# Patient Record
Sex: Male | Born: 1975 | Hispanic: No | Marital: Married | Smoking: Never smoker
Health system: Southern US, Community
[De-identification: ages and names within clinical notes are randomized; demographics above are authoritative.]

## PROBLEM LIST (undated history)

## (undated) DIAGNOSIS — E785 Hyperlipidemia, unspecified: Secondary | ICD-10-CM

## (undated) DIAGNOSIS — I10 Essential (primary) hypertension: Secondary | ICD-10-CM

## (undated) DIAGNOSIS — E119 Type 2 diabetes mellitus without complications: Secondary | ICD-10-CM

## (undated) HISTORY — DX: Hyperlipidemia, unspecified: E78.5

## (undated) HISTORY — DX: Type 2 diabetes mellitus without complications: E11.9

## (undated) HISTORY — DX: Essential (primary) hypertension: I10

---

## 2017-10-25 DIAGNOSIS — E119 Type 2 diabetes mellitus without complications: Secondary | ICD-10-CM | POA: Insufficient documentation

## 2017-10-25 DIAGNOSIS — K219 Gastro-esophageal reflux disease without esophagitis: Secondary | ICD-10-CM | POA: Insufficient documentation

## 2017-10-25 DIAGNOSIS — I10 Essential (primary) hypertension: Secondary | ICD-10-CM | POA: Insufficient documentation

## 2017-10-25 DIAGNOSIS — E7800 Pure hypercholesterolemia, unspecified: Secondary | ICD-10-CM | POA: Insufficient documentation

## 2017-10-25 DIAGNOSIS — Z23 Encounter for immunization: Secondary | ICD-10-CM | POA: Insufficient documentation

## 2019-10-19 ENCOUNTER — Encounter: Payer: Self-pay | Admitting: *Deleted

## 2019-11-01 ENCOUNTER — Encounter: Payer: Self-pay | Admitting: Cardiology

## 2019-11-01 ENCOUNTER — Other Ambulatory Visit: Payer: Self-pay

## 2019-11-01 ENCOUNTER — Ambulatory Visit (INDEPENDENT_AMBULATORY_CARE_PROVIDER_SITE_OTHER): Payer: Managed Care, Other (non HMO) | Admitting: Cardiology

## 2019-11-01 VITALS — BP 112/64 | HR 87 | Ht 70.0 in | Wt 213.2 lb

## 2019-11-01 DIAGNOSIS — R079 Chest pain, unspecified: Secondary | ICD-10-CM

## 2019-11-01 DIAGNOSIS — I10 Essential (primary) hypertension: Secondary | ICD-10-CM | POA: Diagnosis not present

## 2019-11-01 DIAGNOSIS — E7800 Pure hypercholesterolemia, unspecified: Secondary | ICD-10-CM

## 2019-11-01 DIAGNOSIS — R072 Precordial pain: Secondary | ICD-10-CM

## 2019-11-01 DIAGNOSIS — Z01818 Encounter for other preprocedural examination: Secondary | ICD-10-CM | POA: Diagnosis not present

## 2019-11-01 MED ORDER — METOPROLOL TARTRATE 100 MG PO TABS: 100.0000 mg | ORAL_TABLET | Freq: Once | ORAL | 0 refills | Status: DC

## 2019-11-01 NOTE — Patient Instructions (Signed)
Medication Instructions:   1.  Take metoprolol tartrate (LOPRESSOR) 100 MG tablet: Take 2 hours prior to your CT scan 2.  Hold your BP medications night before or morning of your CTA:      losartan (COZAAR) 100 MG       hydrochlorothiazide (MICROZIDE)   *If you need a refill on your cardiac medications before your next appointment, please call your pharmacy*   Lab Work:  Please get a lab draw within 2 weeks of your CTA scan.  - Please go to the [facility]. You will check in at the front desk to the right as you walk into the atrium. Valet Parking is offered if needed.  - No appointment needed. You may go any day between 7 am and 6 pm. If you have labs (blood work) drawn today and your tests are completely normal, you will receive your results only by: Marland Kitchen MyChart Message (if you have MyChart) OR . A paper copy in the mail If you have any lab test that is abnormal or we need to change your treatment, we will call you to review the results.   Testing/Procedures:  1. Your physician has requested that you have an echocardiogram. Echocardiography is a painless test that uses sound waves to create images of your heart. It provides your doctor with information about the size and shape of your heart and how well your heart's chambers and valves are working. This procedure takes approximately one hour. There are no restrictions for this procedure.  2.  Your physician has requested that you have cardiac CT. Cardiac computed tomography (CT) is a painless test that uses an x-ray machine to take clear, detailed pictures of your heart.  Your cardiac CT will be scheduled at one of the below locations:    [facility] [address] [place], [place] [address] [phone]    At [facility], please arrive 15 mins early for check-in and test prep.    Please follow these instructions carefully (unless otherwise  directed):   Hold all erectile dysfunction medications at least 3 days (72 hrs) prior to test.   On the Night Before the Test: . Be sure to Drink plenty of water. . Do not consume any caffeinated/decaffeinated beverages or chocolate 12 hours prior to your test. . Do not take any antihistamines 12 hours prior to your test.   On the Day of the Test: . Drink plenty of water. Do not drink any water within one hour of the test. . Do not eat any food 4 hours prior to the test. . You may take your regular medications prior to the test.  . Take metoprolol (Lopressor) two hours prior to test.  Hold your losartan (COZAAR) 100 MG hydrochlorothiazide (MICROZIDE)          After the Test: . Drink plenty of water. . After receiving IV contrast, you may experience a mild flushed feeling. This is normal. . On occasion, you may experience a mild rash up to 24 hours after the test. This is not dangerous. If this occurs, you can take Benadryl 25 mg and increase your fluid intake. . If you experience trouble breathing, this can be serious. If it is severe call 911 IMMEDIATELY. If it is mild, please call our office. . If you take any of these medications: Glipizide/Metformin, Avandament, Glucavance, please do not take 48 hours after completing test unless otherwise instructed.   Once we have confirmed  authorization from your insurance company, we will call you to set up a date and time for your test. Based on how quickly your insurance processes prior authorizations requests, please allow up to 4 weeks to be contacted for scheduling your Cardiac CT appointment. Be advised that routine Cardiac CT appointments could be scheduled as many as 8 weeks after your provider has ordered it.  For non-scheduling related questions, please contact the cardiac imaging nurse navigator should you have any questions/concerns: Marchia Bond, Cardiac Imaging Nurse Navigator Burley Saver, Interim Cardiac Imaging Nurse  [place] and Vascular Services Direct Office Dial: [phone]   For scheduling needs, including cancellations and rescheduling, please call Vivien Rota at [phone], option 3.    Follow-Up: At [facility], you and your health needs are our priority.  As part of our continuing mission to provide you with exceptional heart care, we have created designated Provider Care Teams.  These Care Teams include your primary Cardiologist (physician) and Advanced Practice Providers (APPs -  Physician Assistants and Nurse Practitioners) who all work together to provide you with the care you need, when you need it.  We recommend signing up for the patient portal called "MyChart".  Sign up information is provided on this After Visit Summary.  MyChart is used to connect with patients for Virtual Visits (Telemedicine).  Patients are able to view lab/test results, encounter notes, upcoming appointments, etc.  Non-urgent messages can be sent to your provider as well.   To learn more about what you can do with MyChart, go to [email or web address].    Your next appointment:   Follow up after CTA and Echo  The format for your next appointment:   In Person  Provider:   Kate Sable, MD   Other Instructions   Echocardiogram An echocardiogram is a procedure that uses painless sound waves (ultrasound) to produce an image of the heart. Images from an echocardiogram can provide important information about:  Signs of coronary artery disease (CAD).  Aneurysm detection. An aneurysm is a weak or damaged part of an artery wall that bulges out from the normal force of blood pumping through the body.  Heart size and shape. Changes in the size or shape of the heart can be associated with certain conditions, including heart failure, aneurysm, and CAD.  Heart muscle function.  Heart valve function.  Signs of a past heart attack.  Fluid buildup around the heart.  Thickening of the heart  muscle.  A tumor or infectious growth around the heart valves. Tell a health care provider about:  Any allergies you have.  All medicines you are taking, including vitamins, herbs, eye drops, creams, and over-the-counter medicines.  Any blood disorders you have.  Any surgeries you have had.  Any medical conditions you have.  Whether you are pregnant or may be pregnant. What are the risks? Generally, this is a safe procedure. However, problems may occur, including:  Allergic reaction to dye (contrast) that may be used during the procedure. What happens before the procedure? No specific preparation is needed. You may eat and drink normally. What happens during the procedure?   An IV tube may be inserted into one of your veins.  You may receive contrast through this tube. A contrast is an injection that improves the quality of the pictures from your heart.  A gel will be applied to your chest.  A wand-like tool (transducer) will be moved over your chest. The gel will help to transmit the sound waves  from the transducer.  The sound waves will harmlessly bounce off of your heart to allow the heart images to be captured in real-time motion. The images will be recorded on a computer. The procedure may vary among health care providers and hospitals. What happens after the procedure?  You may return to your normal, everyday life, including diet, activities, and medicines, unless your health care provider tells you not to do that. Summary  An echocardiogram is a procedure that uses painless sound waves (ultrasound) to produce an image of the heart.  Images from an echocardiogram can provide important information about the size and shape of your heart, heart muscle function, heart valve function, and fluid buildup around your heart.  You do not need to do anything to prepare before this procedure. You may eat and drink normally.  After the echocardiogram is completed, you may  return to your normal, everyday life, unless your health care provider tells you not to do that. This information is not intended to replace advice given to you by your health care provider. Make sure you discuss any questions you have with your health care provider. Document Revised: 07/13/2018 Document Reviewed: 04/24/2016 Elsevier Patient Education  [place].

## 2019-11-01 NOTE — Progress Notes (Signed)
Cardiology Office Note:    Date:  11/01/2019   ID:  Troy Hughes, DOB 02-02-1976, MRN [id number]  PCP:  Troy Gula, MD  [facility] Cardiologist:  Troy Sable, MD  [address] Electrophysiologist:  None   Referring MD: Troy Gula, MD   Chief Complaint  Patient presents with  . New Patient (Initial Visit)    Referred by PCP for Chest pain. Meds reviewed verbally with patient.    Troy Hughes is a 44 y.o. male who is being seen today for the evaluation of chest pain at the request of Troy Gula, MD.   History of Present Illness:    Troy Hughes is a 44 y.o. male with a hx of hypertension, hyperlipidemia, diabetes who presents due to chest pain. Patient had symptoms of chest discomfort first episode. Symptoms are not related with exertion. He states symptoms are moderate to significant maybe 1 out of 10, lasting a few seconds and then go home. Last episode was a month ago. He also notes occasional chest discomfort when stressed. He denies any history of heart disease, his mother for stroke. He denies smoking or alcohol use. He otherwise feels fine, currently denies chest pain.  Past Medical History:  Diagnosis Date  . Diabetes mellitus without complication ([place])   . Hyperlipidemia   . Hypertension     History reviewed. No pertinent surgical history.  Current Medications: Current Meds  Medication Sig  . empagliflozin (JARDIANCE) 10 MG TABS tablet Take by mouth daily.  . hydrochlorothiazide (MICROZIDE) 12.5 MG capsule Take 12.5 mg by mouth daily.  Marland Kitchen losartan (COZAAR) 100 MG tablet Take 100 mg by mouth daily.  Marland Kitchen lovastatin (MEVACOR) 20 MG tablet Take 20 mg by mouth at bedtime.  . metFORMIN (GLUCOPHAGE) 1000 MG tablet Take 1,000 mg by mouth 2 (two) times daily with a meal.     Allergies:   Patient has no known allergies.   Social History   Socioeconomic History  . Marital status: Married    Spouse name: Not on file  . Number of children: Not on  file  . Years of education: Not on file  . Highest education level: Not on file  Occupational History  . Not on file  Tobacco Use  . Smoking status: Never Smoker  . Smokeless tobacco: Never Used  Vaping Use  . Vaping Use: Never used  Substance and Sexual Activity  . Alcohol use: Never  . Drug use: Never  . Sexual activity: Not on file  Other Topics Concern  . Not on file  Social History Narrative  . Not on file   Social Determinants of Health   Financial Resource Strain:   . Difficulty of Paying Living Expenses:   Food Insecurity:   . Worried About Charity fundraiser in the Last Year:   . Arboriculturist in the Last Year:   Transportation Needs:   . Film/video editor (Medical):   Marland Kitchen Lack of Transportation (Non-Medical):   Physical Activity:   . Days of Exercise per Week:   . Minutes of Exercise per Session:   Stress:   . Feeling of Stress :   Social Connections:   . Frequency of Communication with Friends and Family:   . Frequency of Social Gatherings with Friends and Family:   . Attends Religious Services:   . Active Member of Clubs or Organizations:   . Attends Archivist Meetings:   Marland Kitchen Marital Status:      Family History:  The patient's family history includes Hyperlipidemia in his mother; Hypertension in his mother; Kidney disease in his father.  ROS:   Please see the history of present illness.     All other systems reviewed and are negative.  EKGs/Labs/Other Studies Reviewed:    The following studies were reviewed today:   EKG:  EKG is  ordered today.  The ekg ordered today demonstrates normal sinus rhythm, normal ECG  Recent Labs: No results found for requested labs within last 8760 hours.  Recent Lipid Panel No results found for: CHOL, TRIG, HDL, CHOLHDL, VLDL, LDLCALC, LDLDIRECT  Physical Exam:    VS:  BP (!) 112/64 (BP Location: Right Arm, Patient Position: Sitting, Cuff Size: Normal)   Pulse 87   Ht 5' 10" (1.778 m)   Wt (!)  213 lb 4 oz (96.7 kg)   BMI 30.60 kg/m     Wt Readings from Last 3 Encounters:  11/01/19 (!) 213 lb 4 oz (96.7 kg)     GEN:  Well nourished, well developed in no acute distress HEENT: Normal NECK: No JVD; No carotid bruits LYMPHATICS: No lymphadenopathy CARDIAC: RRR, no murmurs, rubs, gallops RESPIRATORY:  Clear to auscultation without rales, wheezing or rhonchi  ABDOMEN: Soft, non-tender, non-distended MUSCULOSKELETAL:  No edema; No deformity  SKIN: Warm and dry NEUROLOGIC:  Alert and oriented x 3 PSYCHIATRIC:  Normal affect   ASSESSMENT:    1. Chest pain of uncertain etiology   2. Essential hypertension   3. Pure hypercholesterolemia   4. Pre-procedural examination   5. Precordial pain    PLAN:    In order of problems listed above:  1. Patient with history of atypical chest discomfort. There is some typical nature to symptoms with chest discomfort sometimes occurring with patient is stressed. He has risk factors of hypertension, hyperlipidemia, diabetes. Will evaluate patient with coronary CTA for presence of CAD. Get echocardiogram to evaluate cardiac function. 2. History of hypertension, BP controlled continue BP meds. Hold BP meds night prior to coronary CTA. 3. History of hyperlipedemia, continue statin.  Follow-up after echo and coronary CTA.  This note was generated in part or whole with voice recognition software. Voice recognition is usually quite accurate but there are transcription errors that can and very often do occur. I apologize for any typographical errors that were not detected and corrected.  Medication Adjustments/Labs and Tests Ordered: Current medicines are reviewed at length with the patient today.  Concerns regarding medicines are outlined above.  Orders Placed This Encounter  Procedures  . CT CORONARY MORPH W/CTA COR W/SCORE W/CA W/CM &/OR WO/CM  . CT CORONARY FRACTIONAL FLOW RESERVE DATA PREP  . CT CORONARY FRACTIONAL FLOW RESERVE FLUID  ANALYSIS  . Basic Metabolic Panel (BMET)  . EKG 12-Lead  . ECHOCARDIOGRAM COMPLETE   Meds ordered this encounter  Medications  . metoprolol tartrate (LOPRESSOR) 100 MG tablet    Sig: Take 1 tablet (100 mg total) by mouth once for 1 dose. Take 2 hours prior to your CT scan.    Dispense:  1 tablet    Refill:  0    Patient Instructions  Medication Instructions:   1.  Take metoprolol tartrate (LOPRESSOR) 100 MG tablet: Take 2 hours prior to your CT scan 2.  Hold your BP medications night before or morning of your CTA:      losartan (COZAAR) 100 MG       hydrochlorothiazide (MICROZIDE)   *If you need a refill on your cardiac medications before  your next appointment, please call your pharmacy*   Lab Work:  Please get a lab draw within 2 weeks of your CTA scan.  - Please go to the [facility]. You will check in at the front desk to the right as you walk into the atrium. Valet Parking is offered if needed.  - No appointment needed. You may go any day between 7 am and 6 pm. If you have labs (blood work) drawn today and your tests are completely normal, you will receive your results only by: Marland Kitchen MyChart Message (if you have MyChart) OR . A paper copy in the mail If you have any lab test that is abnormal or we need to change your treatment, we will call you to review the results.   Testing/Procedures:  1. Your physician has requested that you have an echocardiogram. Echocardiography is a painless test that uses sound waves to create images of your heart. It provides your doctor with information about the size and shape of your heart and how well your heart's chambers and valves are working. This procedure takes approximately one hour. There are no restrictions for this procedure.  2.  Your physician has requested that you have cardiac CT. Cardiac computed tomography (CT) is a painless test that uses an x-ray machine to take clear, detailed pictures of your heart.  Your cardiac CT  will be scheduled at one of the below locations:    [facility] [address] [place], [place] [address] [phone]    At [facility], please arrive 15 mins early for check-in and test prep.    Please follow these instructions carefully (unless otherwise directed):   Hold all erectile dysfunction medications at least 3 days (72 hrs) prior to test.   On the Night Before the Test: . Be sure to Drink plenty of water. . Do not consume any caffeinated/decaffeinated beverages or chocolate 12 hours prior to your test. . Do not take any antihistamines 12 hours prior to your test.   On the Day of the Test: . Drink plenty of water. Do not drink any water within one hour of the test. . Do not eat any food 4 hours prior to the test. . You may take your regular medications prior to the test.  . Take metoprolol (Lopressor) two hours prior to test.  Hold your losartan (COZAAR) 100 MG hydrochlorothiazide (MICROZIDE)          After the Test: . Drink plenty of water. . After receiving IV contrast, you may experience a mild flushed feeling. This is normal. . On occasion, you may experience a mild rash up to 24 hours after the test. This is not dangerous. If this occurs, you can take Benadryl 25 mg and increase your fluid intake. . If you experience trouble breathing, this can be serious. If it is severe call 911 IMMEDIATELY. If it is mild, please call our office. . If you take any of these medications: Glipizide/Metformin, Avandament, Glucavance, please do not take 48 hours after completing test unless otherwise instructed.   Once we have confirmed authorization from your insurance company, we will call you to set up a date and time for your test. Based on how quickly your insurance processes prior authorizations requests, please allow up to 4 weeks to be contacted for scheduling your Cardiac CT appointment. Be advised  that routine Cardiac CT appointments could be scheduled as many as 8 weeks after your provider  has ordered it.  For non-scheduling related questions, please contact the cardiac imaging nurse navigator should you have any questions/concerns: Marchia Bond, Cardiac Imaging Nurse Navigator Burley Saver, Interim Cardiac Imaging Nurse [place] and Vascular Services Direct Office Dial: [phone]   For scheduling needs, including cancellations and rescheduling, please call Vivien Rota at [phone], option [phone].    Follow-Up: At [facility], you and your health needs are our priority.  As part of our continuing mission to provide you with exceptional heart care, we have created designated Provider Care Teams.  These Care Teams include your primary Cardiologist (physician) and Advanced Practice Providers (APPs -  Physician Assistants and Nurse Practitioners) who all work together to provide you with the care you need, when you need it.  We recommend signing up for the patient portal called "MyChart".  Sign up information is provided on this After Visit Summary.  MyChart is used to connect with patients for Virtual Visits (Telemedicine).  Patients are able to view lab/test results, encounter notes, upcoming appointments, etc.  Non-urgent messages can be sent to your provider as well.   To learn more about what you can do with MyChart, go to [email or web address].    Your next appointment:   Follow up after CTA and Echo  The format for your next appointment:   In Person  Provider:   Kate Sable, MD   Other Instructions   Echocardiogram An echocardiogram is a procedure that uses painless sound waves (ultrasound) to produce an image of the heart. Images from an echocardiogram can provide important information about:  Signs of coronary artery disease (CAD).  Aneurysm detection. An aneurysm is a weak or damaged part of an artery wall that bulges out from the normal force of  blood pumping through the body.  Heart size and shape. Changes in the size or shape of the heart can be associated with certain conditions, including heart failure, aneurysm, and CAD.  Heart muscle function.  Heart valve function.  Signs of a past heart attack.  Fluid buildup around the heart.  Thickening of the heart muscle.  A tumor or infectious growth around the heart valves. Tell a health care provider about:  Any allergies you have.  All medicines you are taking, including vitamins, herbs, eye drops, creams, and over-the-counter medicines.  Any blood disorders you have.  Any surgeries you have had.  Any medical conditions you have.  Whether you are pregnant or may be pregnant. What are the risks? Generally, this is a safe procedure. However, problems may occur, including:  Allergic reaction to dye (contrast) that may be used during the procedure. What happens before the procedure? No specific preparation is needed. You may eat and drink normally. What happens during the procedure?   An IV tube may be inserted into one of your veins.  You may receive contrast through this tube. A contrast is an injection that improves the quality of the pictures from your heart.  A gel will be applied to your chest.  A wand-like tool (transducer) will be moved over your chest. The gel will help to transmit the sound waves from the transducer.  The sound waves will harmlessly bounce off of your heart to allow the heart images to be captured in real-time motion. The images will be recorded on a computer. The procedure may vary among health care providers and hospitals. What happens after the procedure?  You may return to your normal, everyday life, including diet, activities, and medicines, unless  your health care provider tells you not to do that. Summary  An echocardiogram is a procedure that uses painless sound waves (ultrasound) to produce an image of the heart.  Images  from an echocardiogram can provide important information about the size and shape of your heart, heart muscle function, heart valve function, and fluid buildup around your heart.  You do not need to do anything to prepare before this procedure. You may eat and drink normally.  After the echocardiogram is completed, you may return to your normal, everyday life, unless your health care provider tells you not to do that. This information is not intended to replace advice given to you by your health care provider. Make sure you discuss any questions you have with your health care provider. Document Revised: 07/13/2018 Document Reviewed: 04/24/2016 Elsevier Patient Education  2020 [place], Troy Sable, MD  11/01/2019 12:36 PM    [place]

## 2019-11-13 ENCOUNTER — Other Ambulatory Visit: Payer: Self-pay | Admitting: Cardiology

## 2019-11-14 ENCOUNTER — Telehealth (HOSPITAL_COMMUNITY): Payer: Self-pay | Admitting: *Deleted

## 2019-11-14 LAB — BASIC METABOLIC PANEL
BUN/Creatinine Ratio: 9 (ref 9–20)
BUN: 8 mg/dL (ref 6–24)
CO2: 23 mmol/L (ref 20–29)
Calcium: 9.8 mg/dL (ref 8.7–10.2)
Chloride: 101 mmol/L (ref 96–106)
Creatinine, Ser: 0.86 mg/dL (ref 0.76–1.27)
GFR calc Af Amer: 124 mL/min/{1.73_m2} (ref >59)
GFR calc non Af Amer: 107 mL/min/{1.73_m2} (ref >59)
Glucose: 138 mg/dL — ABNORMAL HIGH (ref 65–99)
Potassium: 5 mmol/L (ref 3.5–5.2)
Sodium: 139 mmol/L (ref 134–144)

## 2019-11-14 NOTE — Telephone Encounter (Signed)
Reaching out to patient to offer assistance regarding upcoming cardiac imaging study; pt verbalizes understanding of appt date/time, parking situation and where to check in, pre-test NPO status and medications ordered, and verified current allergies; name and call back number provided for further questions should they arise  Seema Blum Tai RN Navigator Cardiac Imaging  Heart and Vascular [phone] office [phone] cell 

## 2019-11-15 ENCOUNTER — Other Ambulatory Visit: Payer: Self-pay

## 2019-11-15 ENCOUNTER — Ambulatory Visit
Admission: RE | Admit: 2019-11-15 | Discharge: 2019-11-15 | Disposition: A | Discharge status: Home / Self Care | Payer: Managed Care, Other (non HMO) | Source: Ambulatory Visit | Attending: Cardiology | Admitting: Cardiology

## 2019-11-15 ENCOUNTER — Other Ambulatory Visit: Payer: Self-pay | Admitting: Cardiology

## 2019-11-15 DIAGNOSIS — R072 Precordial pain: Secondary | ICD-10-CM

## 2019-11-15 LAB — POCT I-STAT CREATININE: Creatinine, Ser: 0.8 mg/dL (ref 0.61–1.24)

## 2019-11-15 MED ORDER — NITROGLYCERIN 0.4 MG SL SUBL
0.8000 mg | SUBLINGUAL_TABLET | Freq: Once | SUBLINGUAL | Status: AC
  Administered 2019-11-15: 0.8 mg via SUBLINGUAL

## 2019-11-15 MED ORDER — IOHEXOL 350 MG/ML SOLN
85.0000 mL | Freq: Once | INTRAVENOUS | Status: AC | PRN
  Administered 2019-11-15: 85 mL via INTRAVENOUS

## 2019-11-15 MED ORDER — METOPROLOL TARTRATE 5 MG/5ML IV SOLN
10.0000 mg | Freq: Once | INTRAVENOUS | Status: AC
  Administered 2019-11-15: 10 mg via INTRAVENOUS

## 2019-11-15 MED ORDER — DILTIAZEM HCL 25 MG/5ML IV SOLN
10.0000 mg | Freq: Once | INTRAVENOUS | Status: AC
  Administered 2019-11-15: 10 mg via INTRAVENOUS

## 2019-11-15 MED ORDER — DILTIAZEM HCL 25 MG/5ML IV SOLN
5.0000 mg | Freq: Once | INTRAVENOUS | Status: AC
  Administered 2019-11-15: 5 mg via INTRAVENOUS

## 2019-11-15 MED ORDER — NITROGLYCERIN 0.4 MG SL SUBL: 0.4000 mg | SUBLINGUAL_TABLET | Freq: Once | SUBLINGUAL | Status: DC

## 2019-11-15 NOTE — Progress Notes (Signed)
Patient tolerated procedure well.Dr. Myriam Forehand notified of medications given and HR. Decision was made to cancel the scan.  Pt ambulate w/o difficulty. Sitting in chair with wife at side. Explained why scan wasn't completed. No further needs. All questions answered. ABC intact. Discharge from procedure area w/o issues.

## 2019-11-16 ENCOUNTER — Telehealth: Payer: Self-pay | Admitting: *Deleted

## 2019-11-16 ENCOUNTER — Other Ambulatory Visit (HOSPITAL_COMMUNITY): Payer: Self-pay | Admitting: Emergency Medicine

## 2019-11-16 DIAGNOSIS — R072 Precordial pain: Secondary | ICD-10-CM

## 2019-11-16 MED ORDER — IVABRADINE HCL 5 MG PO TABS: 10.0000 mg | ORAL_TABLET | Freq: Once | ORAL | 0 refills | Status: AC

## 2019-11-16 MED ORDER — METOPROLOL TARTRATE 100 MG PO TABS
100.0000 mg | ORAL_TABLET | Freq: Once | ORAL | 0 refills | Status: AC
Start: 2019-11-16 — End: 2019-11-16

## 2019-11-16 NOTE — Progress Notes (Addendum)
Attempted to e-prescribe medications for use for next heart CTA appt. Patient does not have preferred pharmacy on file, noted where last rx was printed for patient.  [place]

## 2019-11-16 NOTE — Telephone Encounter (Signed)
Received message from Dr. Azucena Cecil and Rockwell Alexandria, RN about patient's Coronary CT performed yesterday.  Only the Calcium score was able to be performed. Dr. Azucena Cecil requested another Coronary CT order to be placed along with lopressor 100mg  po and ivabradine 10mg  po x 1 prior to follow up scan.  , RN asked for the prescriptions to be printed because patient does not have a preferred pharmacy. She will let the patient know the prescriptions will be at our office front desk to pick up.  Rx printed and placed at front desk.

## 2019-11-21 ENCOUNTER — Telehealth: Payer: Self-pay

## 2019-11-21 NOTE — Telephone Encounter (Signed)
Spoke with patients wife to give clarification on if patient should remain taking Lovastatin as charted in previous result note from Calcium score. Dr. Azucena Cecil recommended the patient remain on Lovastatin.  Patient informed me they were able to reschedule the rest of the CTA for 11/29/19, and she requested an earlier follow up appointment then 9/13. I rescheduled her for 8/30.  Patients wife verbalized understanding and agreed with plan.

## 2019-11-27 ENCOUNTER — Telehealth (HOSPITAL_COMMUNITY): Payer: Self-pay | Admitting: Emergency Medicine

## 2019-11-27 NOTE — Telephone Encounter (Signed)
called both numbers provided, unable to leave VM

## 2019-11-28 ENCOUNTER — Other Ambulatory Visit: Payer: Self-pay

## 2019-11-28 ENCOUNTER — Ambulatory Visit (INDEPENDENT_AMBULATORY_CARE_PROVIDER_SITE_OTHER): Payer: Managed Care, Other (non HMO)

## 2019-11-28 DIAGNOSIS — E7800 Pure hypercholesterolemia, unspecified: Secondary | ICD-10-CM

## 2019-11-28 DIAGNOSIS — R079 Chest pain, unspecified: Secondary | ICD-10-CM

## 2019-11-28 DIAGNOSIS — I10 Essential (primary) hypertension: Secondary | ICD-10-CM | POA: Diagnosis not present

## 2019-11-28 DIAGNOSIS — R072 Precordial pain: Secondary | ICD-10-CM

## 2019-11-28 DIAGNOSIS — Z01818 Encounter for other preprocedural examination: Secondary | ICD-10-CM | POA: Diagnosis not present

## 2019-11-28 LAB — ECHOCARDIOGRAM COMPLETE
AR max vel: 3.17 cm2
AV Area VTI: 3.07 cm2
AV Area mean vel: 3.07 cm2
AV Mean grad: 3 mmHg
AV Peak grad: 5.3 mmHg
Ao pk vel: 1.15 m/s
Area-P 1/2: 3.36 cm2
Calc EF: 61.9 %
S' Lateral: 2.5 cm
Single Plane A2C EF: 60.8 %
Single Plane A4C EF: 62.3 %

## 2019-11-29 ENCOUNTER — Ambulatory Visit
Admission: RE | Admit: 2019-11-29 | Discharge: 2019-11-29 | Disposition: A | Discharge status: Home / Self Care | Payer: Managed Care, Other (non HMO) | Source: Ambulatory Visit | Attending: Cardiology | Admitting: Cardiology

## 2019-11-29 DIAGNOSIS — R072 Precordial pain: Secondary | ICD-10-CM | POA: Insufficient documentation

## 2019-11-29 MED ORDER — NITROGLYCERIN 0.4 MG SL SUBL
0.8000 mg | SUBLINGUAL_TABLET | Freq: Once | SUBLINGUAL | Status: AC
  Administered 2019-11-29: 0.8 mg via SUBLINGUAL

## 2019-11-29 MED ORDER — DILTIAZEM HCL 25 MG/5ML IV SOLN
10.0000 mg | Freq: Once | INTRAVENOUS | Status: AC
  Administered 2019-11-29: 10 mg via INTRAVENOUS

## 2019-11-29 MED ORDER — METOPROLOL TARTRATE 5 MG/5ML IV SOLN
10.0000 mg | Freq: Once | INTRAVENOUS | Status: AC
  Administered 2019-11-29: 10 mg via INTRAVENOUS

## 2019-11-29 MED ORDER — IOHEXOL 350 MG/ML SOLN
100.0000 mL | Freq: Once | INTRAVENOUS | Status: AC | PRN
  Administered 2019-11-29: 100 mL via INTRAVENOUS

## 2019-11-29 NOTE — Progress Notes (Signed)
Patient tolerated procedure well. Ambulate w/o difficulty. Denies being dizzy or light headed Sitting in chair drinking water. No needs. All questions answered. ABC intact. Discharge from procedure area w/o issues. Wife at side.

## 2019-12-03 ENCOUNTER — Encounter: Payer: Self-pay | Admitting: Cardiology

## 2019-12-03 ENCOUNTER — Ambulatory Visit: Payer: Managed Care, Other (non HMO) | Admitting: Cardiology

## 2019-12-03 ENCOUNTER — Other Ambulatory Visit: Payer: Self-pay

## 2019-12-03 VITALS — BP 120/82 | HR 94 | Ht 70.0 in | Wt 211.0 lb

## 2019-12-03 DIAGNOSIS — R079 Chest pain, unspecified: Secondary | ICD-10-CM | POA: Diagnosis not present

## 2019-12-03 DIAGNOSIS — E7800 Pure hypercholesterolemia, unspecified: Secondary | ICD-10-CM

## 2019-12-03 DIAGNOSIS — I10 Essential (primary) hypertension: Secondary | ICD-10-CM | POA: Diagnosis not present

## 2019-12-03 NOTE — Patient Instructions (Signed)

## 2019-12-03 NOTE — Progress Notes (Signed)
Cardiology Office Note:    Date:  12/03/2019   ID:  Troy Hughes, DOB [birth year], MRN [id number]  PCP:  Barbaraann Boys, MD  [facility] Cardiologist:  Debbe Odea, MD  [facility] Electrophysiologist:  None   Referring MD: Barbaraann Boys, MD   Chief Complaint  Patient presents with  . Follow-up    Follow up for testing. Medications verbally reviewed with patient.      History of Present Illness:    Troy Hughes is a 44 y.o. male with a hx of hypertension, hyperlipidemia, diabetes who presents for follow-up.  He was last seen due to chest pain.  Due to risk factors, echocardiogram and coronary CTA was performed to evaluate cardiac function.  Currently denies chest pain, states feeling okay.  Takes all his medications as prescribed.  BP usually controlled at home.  Would like to discuss test results.   Past Medical History:  Diagnosis Date  . Diabetes mellitus without complication (HCC)   . Hyperlipidemia   . Hypertension     History reviewed. No pertinent surgical history.  Current Medications: Current Meds  Medication Sig  . empagliflozin (JARDIANCE) 10 MG TABS tablet Take by mouth daily.  . hydrochlorothiazide (MICROZIDE) 12.5 MG capsule Take 12.5 mg by mouth daily.  Marland Kitchen losartan (COZAAR) 100 MG tablet Take 100 mg by mouth daily.  Marland Kitchen lovastatin (MEVACOR) 20 MG tablet Take 20 mg by mouth at bedtime.  . metFORMIN (GLUCOPHAGE) 1000 MG tablet Take 1,000 mg by mouth 2 (two) times daily with a meal.     Allergies:   Patient has no known allergies.   Social History   Socioeconomic History  . Marital status: Married    Spouse name: Not on file  . Number of children: Not on file  . Years of education: Not on file  . Highest education level: Not on file  Occupational History  . Not on file  Tobacco Use  . Smoking status: Never Smoker  . Smokeless tobacco: Never Used  Vaping Use  . Vaping Use: Never used  Substance and Sexual Activity  . Alcohol use:  Never  . Drug use: Never  . Sexual activity: Not on file  Other Topics Concern  . Not on file  Social History Narrative  . Not on file   Social Determinants of Health   Financial Resource Strain:   . Difficulty of Paying Living Expenses: Not on file  Food Insecurity:   . Worried About Programme researcher, broadcasting/film/video in the Last Year: Not on file  . Ran Out of Food in the Last Year: Not on file  Transportation Needs:   . Lack of Transportation (Medical): Not on file  . Lack of Transportation (Non-Medical): Not on file  Physical Activity:   . Days of Exercise per Week: Not on file  . Minutes of Exercise per Session: Not on file  Stress:   . Feeling of Stress : Not on file  Social Connections:   . Frequency of Communication with Friends and Family: Not on file  . Frequency of Social Gatherings with Friends and Family: Not on file  . Attends Religious Services: Not on file  . Active Member of Clubs or Organizations: Not on file  . Attends Banker Meetings: Not on file  . Marital Status: Not on file     Family History: The patient's family history includes Hyperlipidemia in his mother; Hypertension in his mother; Kidney disease in his father.  ROS:   Please see  the history of present illness.     All other systems reviewed and are negative.  EKGs/Labs/Other Studies Reviewed:    The following studies were reviewed today:   EKG:  EKG is  ordered today.  The ekg ordered today demonstrates normal sinus rhythm, normal ECG  Recent Labs: 11/13/2019: BUN 8; Potassium 5.0; Sodium 139 11/15/2019: Creatinine, Ser 0.80  Recent Lipid Panel No results found for: CHOL, TRIG, HDL, CHOLHDL, VLDL, LDLCALC, LDLDIRECT  Physical Exam:    VS:  BP 120/82 (BP Location: Left Arm, Patient Position: Sitting, Cuff Size: Normal)   Pulse 94   Ht 5\' 10"  (1.778 m)   Wt 211 lb (95.7 kg)   SpO2 98%   BMI 30.28 kg/m     Wt Readings from Last 3 Encounters:  12/03/19 211 lb (95.7 kg)  11/01/19  (!) 213 lb 4 oz (96.7 kg)     GEN:  Well nourished, well developed in no acute distress HEENT: Normal NECK: No JVD; No carotid bruits LYMPHATICS: No lymphadenopathy CARDIAC: RRR, no murmurs, rubs, gallops RESPIRATORY:  Clear to auscultation without rales, wheezing or rhonchi  ABDOMEN: Soft, non-tender, non-distended MUSCULOSKELETAL:  No edema; No deformity  SKIN: Warm and dry NEUROLOGIC:  Alert and oriented x 3 PSYCHIATRIC:  Normal affect   ASSESSMENT:    1. Chest pain of uncertain etiology   2. Essential hypertension   3. Pure hypercholesterolemia    PLAN:    In order of problems listed above:  1. Patient with history of atypical chest discomfort.  Currently asymptomatic.  He has risk factors of hypertension, hyperlipidemia, diabetes.  Echocardiogram showed normal systolic and diastolic function, EF 60 to 65%.  Coronary CT showed a very low calcium score of 17.9.  Coronary CT showed mild calcified plaque in the proximal LAD causing minimal stenosis (0-24%).  Patient symptoms of chest pain not due to CAD.  Patient reassured. 2. History of hypertension, BP controlled, continue losartan and HCTZ. 3. History of hyperlipedemia, continue lovastatin.  Follow-up as needed  Total encounter time 35 minutes  Greater than 50% was spent in counseling and coordination of care with the patient   This note was generated in part or whole with voice recognition software. Voice recognition is usually quite accurate but there are transcription errors that can and very often do occur. I apologize for any typographical errors that were not detected and corrected.  Medication Adjustments/Labs and Tests Ordered: Current medicines are reviewed at length with the patient today.  Concerns regarding medicines are outlined above.  Orders Placed This Encounter  Procedures  . EKG 12-Lead   No orders of the defined types were placed in this encounter.   Patient Instructions  Medication Instructions:    Your physician recommends that you continue on your current medications as directed. Please refer to the Current Medication list given to you today. *If you need a refill on your cardiac medications before your next appointment, please call your pharmacy*   Lab Work: None Ordered If you have labs (blood work) drawn today and your tests are completely normal, you will receive your results only by: 11/03/19 MyChart Message (if you have MyChart) OR . A paper copy in the mail If you have any lab test that is abnormal or we need to change your treatment, we will call you to review the results.   Testing/Procedures: None Ordered   Follow-Up: At [facility] [place], you and your health needs are our priority.  As part of our continuing mission  to provide you with exceptional heart care, we have created designated Provider Care Teams.  These Care Teams include your primary Cardiologist (physician) and Advanced Practice Providers (APPs -  Physician Assistants and Nurse Practitioners) who all work together to provide you with the care you need, when you need it.  We recommend signing up for the patient portal called "MyChart".  Sign up information is provided on this After Visit Summary.  MyChart is used to connect with patients for Virtual Visits (Telemedicine).  Patients are able to view lab/test results, encounter notes, upcoming appointments, etc.  Non-urgent messages can be sent to your provider as well.   To learn more about what you can do with MyChart, go to [email or web address].    Your next appointment:   Follow up as needed   The format for your next appointment:   In Person  Provider:   Debbe Odea, MD   Other Instructions      Signed, Debbe Odea, MD  12/03/2019 5:32 PM    [facility]

## 2019-12-17 ENCOUNTER — Ambulatory Visit: Payer: Managed Care, Other (non HMO) | Admitting: Cardiology

## 2021-08-19 IMAGING — CT CT HEART MORP W/ CTA COR W/ SCORE W/ CA W/CM &/OR W/O CM
2 of 10 series · 8 of 20 positions shown, 10 images · non-contrast
Comparison: None.

Addendum:
CLINICAL DATA: chestpain

EXAM:
Cardiac/Coronary  CTA
TECHNIQUE: The patient was scanned on a Siemens Somatoform go.Top scanner.

[Series 19: multiphase % cta coronary 0.60 · axial · 0.33mm/px · z∈[+1708,+1785]mm · 5 of 2890 slices shown, 7 images]
[im 482/2890  vessel]
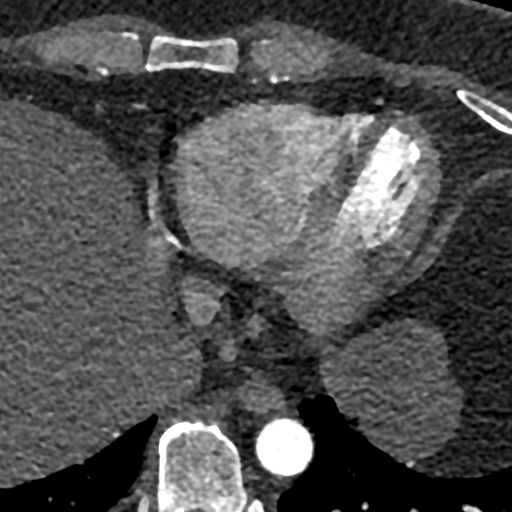
[im 482/2890  lung]
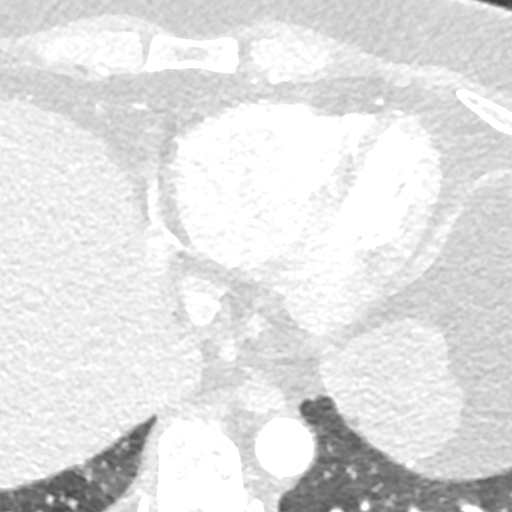
[im 964/2890  vessel]
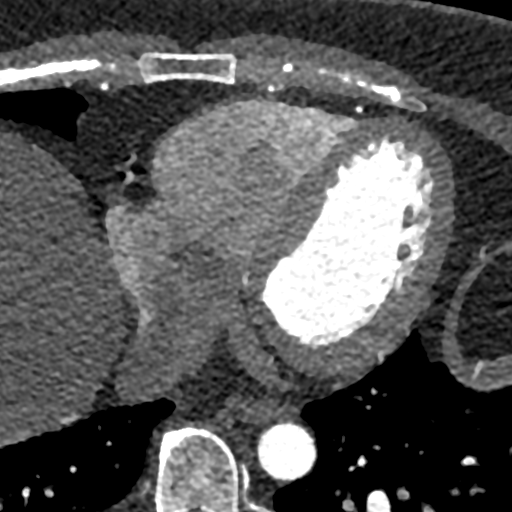
[im 1445/2890  vessel]
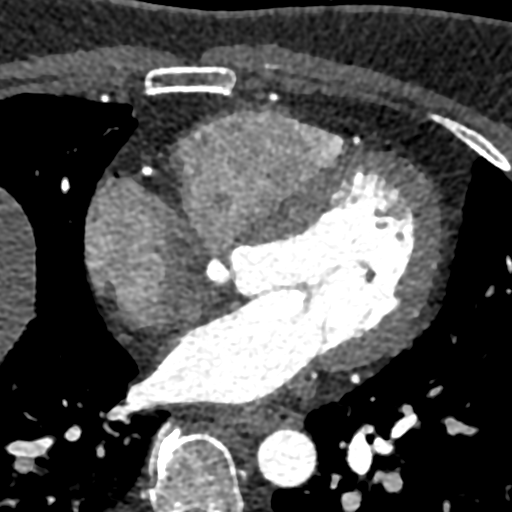
[im 1927/2890  vessel]
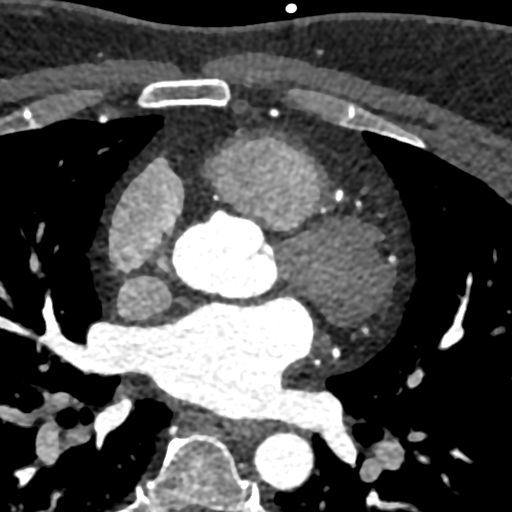
[im 2408/2890  vessel]
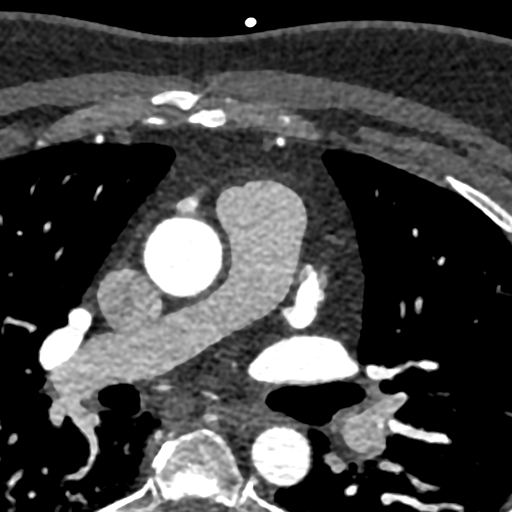
[im 2408/2890  lung]
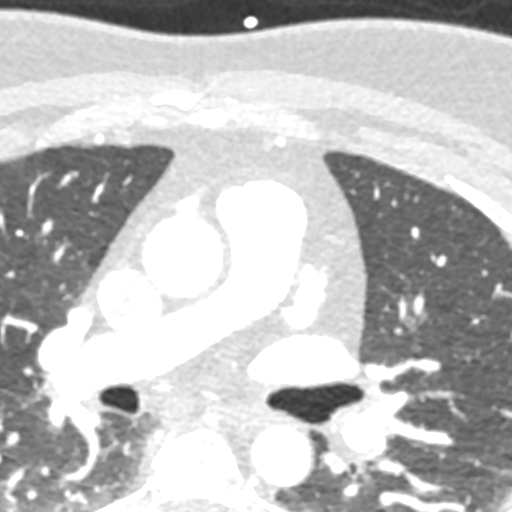

[Series 34: ms multiphase cta coronary 0.60 · axial · 0.33mm/px · z∈[+1718,+1775]mm · 3 of 2312 slices shown]
[im 578/2312  vessel]
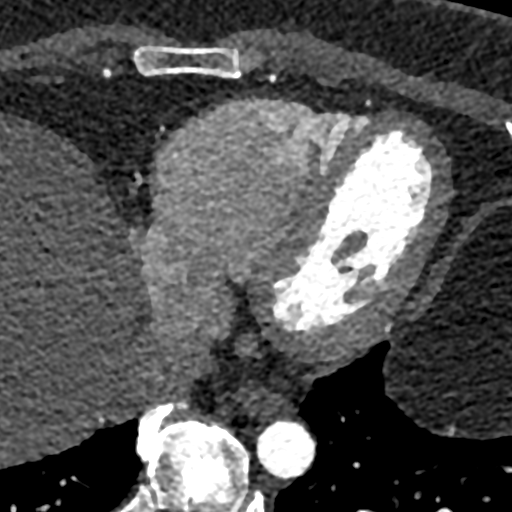
[im 1156/2312  vessel]
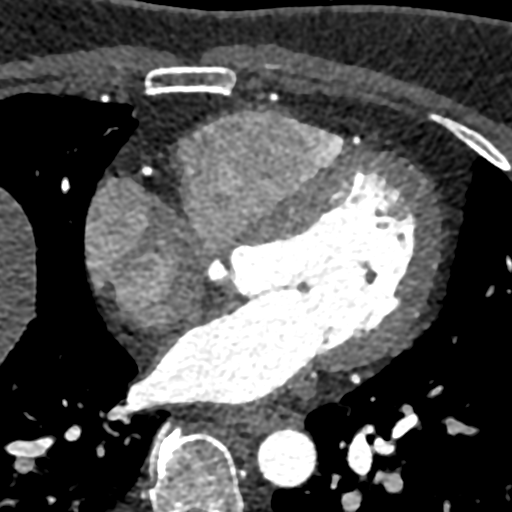
[im 1734/2312  vessel]
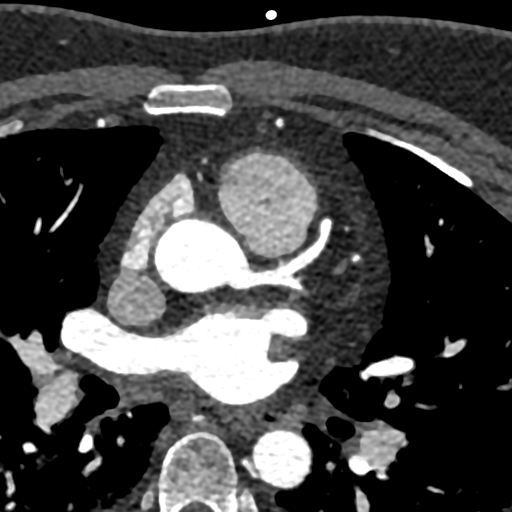

[8 of 20 positions shown; findings below may reference images not displayed]

FINDINGS: A retrospective scan was triggered in the descending thoracic aorta.
Axial non-contrast 3 mm slices were carried out through the heart.
The data set was analyzed on a dedicated work station and scored
using the Agatson method. Gantry rotation speed was 330 msecs and
collimation was .6 mm. 100mg of metoprolol, 10mg po ivabradine and
0.8 mg of sl NTG was given. The 3D data set was reconstructed in 5%
intervals of the 40-95 % of the R-R cycle. Diastolic phases were
analyzed on a dedicated work station using MPR, MIP and VRT modes.
The patient received 100 cc of contrast.

Aorta:  Normal size.  No calcifications.  No dissection.

Aortic Valve:  Trileaflet.  No calcifications.

Coronary Arteries:  Normal coronary origin.  Right dominance.

RCA is a large dominant artery that gives rise to PDA and PLA. There
is no plaque.

Left main is a large artery that gives rise to LAD and LCX arteries.

LAD is a large vessel that has mild calcified plaque in the proximal
LAD causing minimal stenosis (0-24%).

LCX is a non-dominant artery that gives rise to two obtuse marginal
branches. There is no plaque.

Other findings:

Normal pulmonary vein drainage into the left atrium.

Normal left atrial appendage without a thrombus.

Normal size of the pulmonary artery.
IMPRESSION: 1. Coronary calcium score was previously performed and reported.

2. Normal coronary origin with right dominance.

3. Mild calcified plaque in the proximal LAD causing minimal
stenosis (0-24%).

4. CAD-RADS 1. Minimal non-obstructive CAD (0-24%). Consider
non-atherosclerotic causes of chest pain. Consider preventive
therapy and risk factor modification.

EXAM:
OVER-READ INTERPRETATION  CT CHEST

The following report is an over-read performed by radiologist Dr.
does not include interpretation of cardiac or coronary anatomy or
pathology. The coronary calcium score/coronary CTA interpretation by
the cardiologist is attached.
FINDINGS: The visualized portions of the lower lung fields show no suspicious
nodules, masses, or infiltrates. No pleural fluid seen.

The visualized portions of the mediastinum and chest wall are
unremarkable.
IMPRESSION: No significant non-cardiovascular abnormality seen in visualized
portion of the thorax.

*** End of Addendum ***
FINDINGS: A retrospective scan was triggered in the descending thoracic aorta.
Axial non-contrast 3 mm slices were carried out through the heart.
The data set was analyzed on a dedicated work station and scored
using the Agatson method. Gantry rotation speed was 330 msecs and
collimation was .6 mm. 100mg of metoprolol, 10mg po ivabradine and
0.8 mg of sl NTG was given. The 3D data set was reconstructed in 5%
intervals of the 40-95 % of the R-R cycle. Diastolic phases were
analyzed on a dedicated work station using MPR, MIP and VRT modes.
The patient received 100 cc of contrast.

Aorta:  Normal size.  No calcifications.  No dissection.

Aortic Valve:  Trileaflet.  No calcifications.

Coronary Arteries:  Normal coronary origin.  Right dominance.

RCA is a large dominant artery that gives rise to PDA and PLA. There
is no plaque.

Left main is a large artery that gives rise to LAD and LCX arteries.

LAD is a large vessel that has mild calcified plaque in the proximal
LAD causing minimal stenosis (0-24%).

LCX is a non-dominant artery that gives rise to two obtuse marginal
branches. There is no plaque.

Other findings:

Normal pulmonary vein drainage into the left atrium.

Normal left atrial appendage without a thrombus.

Normal size of the pulmonary artery.
IMPRESSION: 1. Coronary calcium score was previously performed and reported.

2. Normal coronary origin with right dominance.

3. Mild calcified plaque in the proximal LAD causing minimal
stenosis (0-24%).

4. CAD-RADS 1. Minimal non-obstructive CAD (0-24%). Consider
non-atherosclerotic causes of chest pain. Consider preventive
therapy and risk factor modification.
